# Patient Record
Sex: Male | Born: 1995 | Race: White | Hispanic: No
Health system: Southern US, Community
[De-identification: ages and names within clinical notes are randomized; demographics above are authoritative.]

---

## 2016-07-31 ENCOUNTER — Emergency Department
Admission: EM | Admit: 2016-07-31 | Discharge: 2016-07-31 | Disposition: A | Discharge status: Home / Self Care | Payer: BLUE CROSS/BLUE SHIELD | Attending: Emergency Medicine | Admitting: Emergency Medicine

## 2016-07-31 ENCOUNTER — Emergency Department: Payer: BLUE CROSS/BLUE SHIELD

## 2016-07-31 DIAGNOSIS — R4182 Altered mental status, unspecified: Secondary | ICD-10-CM | POA: Diagnosis present

## 2016-07-31 DIAGNOSIS — F1010 Alcohol abuse, uncomplicated: Secondary | ICD-10-CM | POA: Diagnosis not present

## 2016-07-31 LAB — ETHANOL: Alcohol, Ethyl (B): 301 mg/dL (ref <5)

## 2016-07-31 MED ORDER — SODIUM CHLORIDE 0.9 % IV BOLUS (SEPSIS)
1000.0000 mL | Freq: Once | INTRAVENOUS | Status: AC
  Administered 2016-07-31: 1000 mL via INTRAVENOUS

## 2016-07-31 NOTE — ED Notes (Signed)
Pt awake and now realizes he's in the emergency room; up to toilet to void with no difficulty ambulating; given blue scrubs to change into; pt talking in complete coherent sentences

## 2016-07-31 NOTE — ED Notes (Signed)
Pt stirs when name called but does not open eyes, no verbal communication; will continue to monitor

## 2016-07-31 NOTE — Discharge Instructions (Signed)
Please drink plenty of fluids and take Tylenol and/or ibuprofen today.  Please return immediately if condition worsens. Please contact her primary physician or the physician you were given for referral. If you have any specialist physicians involved in her treatment and plan please also contact them. Thank you for using [facility].

## 2016-07-31 NOTE — ED Triage Notes (Signed)
Pt had to be assisted out of car to stretcher by multiple staff members due to intoxication; vomit on face and clothing; pt does admit to drinking alcohol but will not state amount; denies drug use; will answer questions with short answer before nodding back off to sleep; cleaned well and placed in dry gown with yellow socks for safety

## 2016-07-31 NOTE — ED Notes (Signed)
Pt back from CT; side rails up x 2; sleeping soundly; will continue to monitor

## 2016-07-31 NOTE — ED Notes (Signed)
Pt sleeping soundly with unlabored respirations; HOB lowered and head placed on pillow for comfort; skin cool, warm blanket place over pt

## 2016-07-31 NOTE — ED Provider Notes (Signed)
Time Seen: Approximately 0121  I have reviewed the triage notes  Chief Complaint: Alcohol Intoxication   History of Present Illness: Anthony MinaDerek Waters is a 21 y.o. male who presents to have been removed from the vehicle and admits to heavy alcohol consumption. He had some nausea vomiting that was noticed to be on his face and clothing. He does not appear to be in any respiratory distress. He denies any illicit drug usage. History of obvious trauma.   No past medical history on file.  There are no active problems to display for this patient.   No past surgical history on file.  No past surgical history on file.    Allergies:  Patient has no known allergies.  Family History: No family history on file.  Social History: Social History  Substance Use Topics  . Smoking status: Not on file  . Smokeless tobacco: Not on file  . Alcohol use Not on file     Review of Systems:   10 point review of systems was performed and was otherwise negative: Review of systems was acquired later on during the patient's stay Constitutional: No fever Eyes: No visual disturbances ENT: No sore throat, ear pain Cardiac: No chest pain Respiratory: No shortness of breath, wheezing, or stridor Abdomen: No abdominal pain, no vomiting, No diarrhea Endocrine: No weight loss, No night sweats Extremities: No peripheral edema, cyanosis Skin: No rashes, easy bruising Neurologic: No focal weakness, trouble with speech or swollowing Urologic: No dysuria, Hematuria, or urinary frequency   Physical Exam:  ED Triage Vitals  Enc Vitals Group     BP 07/31/16 0053 (!) 125/105     Pulse Rate 07/31/16 0053 82     Resp 07/31/16 0053 (!) 21     Temp --      Temp src --      SpO2 07/31/16 0053 100 %     Weight 07/31/16 0102 165 lb (74.8 kg)     Height 07/31/16 0102 5\' 8"  (1.727 m)     Head Circumference --      Peak Flow --      Pain Score --      Pain Loc --      Pain Edu? --      Excl. in GC? --      General:Patient is arousable and appears to be protecting his airway though is obviously inebriated with diminished mental status Head: Normal cephalic , atraumatic Eyes: Pupils equal , round, reactive to light Nose/Throat: No nasal drainage, patent upper airway without erythema or exudate.  Neck: Supple, Full range of motion, No anterior adenopathy or palpable thyroid masses Lungs: Clear to ascultation without wheezes , rhonchi, or rales Heart: Regular rate, regular rhythm without murmurs , gallops , or rubs Abdomen: Soft, non tender without rebound, guarding , or rigidity; bowel sounds positive and symmetric in all 4 quadrants. No organomegaly .        Extremities: 2 plus symmetric pulses. No edema, clubbing or cyanosis Neurologic: normal ambulation, Motor symmetric without deficits, sensory intact Skin: warm, dry, no rashes   Labs:   All laboratory work was reviewed including any pertinent negatives or positives listed below:  Labs Reviewed  ETHANOL - Abnormal; Notable for the following:       Result Value   Alcohol, Ethyl (B) 301 (*)    All other components within normal limits    Radiology: * "Ct Head Wo Contrast  Result Date: 07/31/2016 CLINICAL DATA:  Unresponsive EXAM: CT  HEAD WITHOUT CONTRAST TECHNIQUE: Contiguous axial images were obtained from the base of the skull through the vertex without intravenous contrast. COMPARISON:  None. FINDINGS: Brain: No evidence of acute infarction, hemorrhage, hydrocephalus, extra-axial collection or mass lesion/mass effect. Vascular: No hyperdense vessel or unexpected calcification. Skull: Normal. Negative for fracture or focal lesion. Sinuses/Orbits: No acute finding. Other: None. IMPRESSION: No CT evidence for acute intracranial abnormality Electronically Signed   By: Jasmine Pang M.D.   On: 07/31/2016 01:31  " I personally reviewed the radiologic studies   ED ECG REPORT I, Jennye Moccasin, the attending physician, personally viewed  and interpreted this ECG.  Date: 07/31/2016 EKG Time: *0057 Rate: 78Rhythm: normal sinus rhythm QRS Axis: normal Intervals: normal ST/T Wave abnormalities: normal Conduction Disturbances: none Narrative Interpretation: unremarkable No acute ischemic changes  ED Course:  Patient was given a liter of fluid will be observed here in emergency department. Upon arousal patient will be reevaluated to make sure he is not suicidal, homicidal or having hallucinations able to demonstrate ambulation on his own accord. We found no other evidence of altered mental status other than alcohol intoxication. Clinical Course      Assessment: * Acute alcohol intoxication     Plan: * Observed to sobriety            Jennye Moccasin, MD 07/31/16 0630

## 2016-07-31 NOTE — ED Notes (Signed)
Alcohol 301 reported to Dr Huel CoteQuigley; no new orders give at this time

## 2018-01-18 IMAGING — CT CT HEAD W/O CM
3 series · 16 of 47 positions shown, 19 images · non-contrast
Comparison: None.

CLINICAL DATA: Unresponsive

EXAM:
CT HEAD WITHOUT CONTRAST
TECHNIQUE: Contiguous axial images were obtained from the base of the skull
through the vertex without intravenous contrast.

[Series 2: head wo · axial · 0.44mm/px · z∈[-41,+94]mm · 10 of 33 slices shown, 13 images]
[im 3/33  brain]
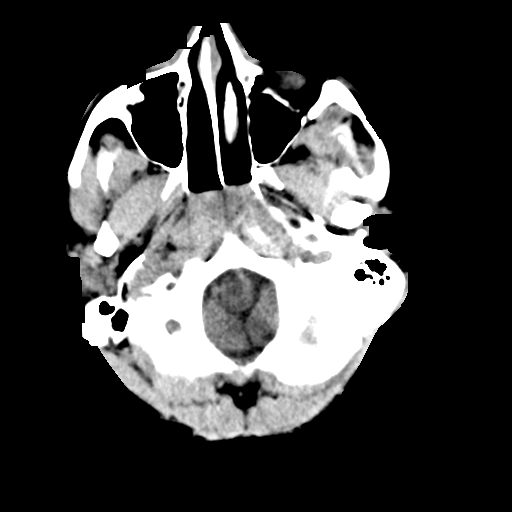
[im 3/33  bone]
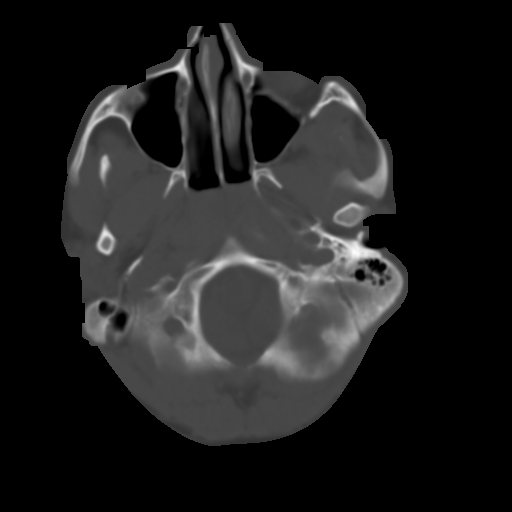
[im 6/33  brain]
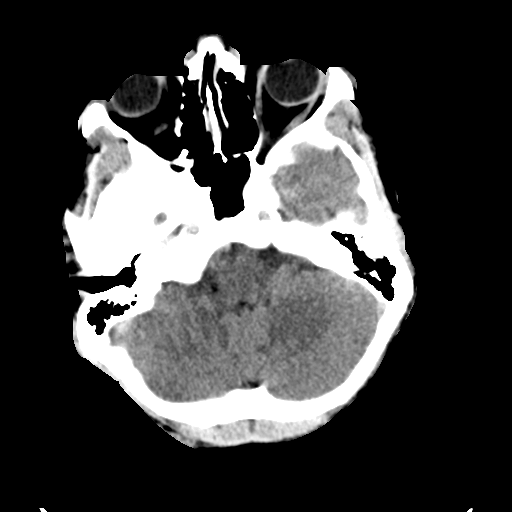
[im 9/33  brain]
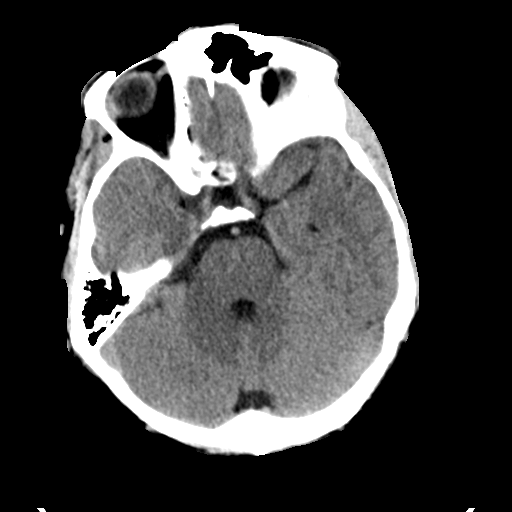
[im 12/33  brain]
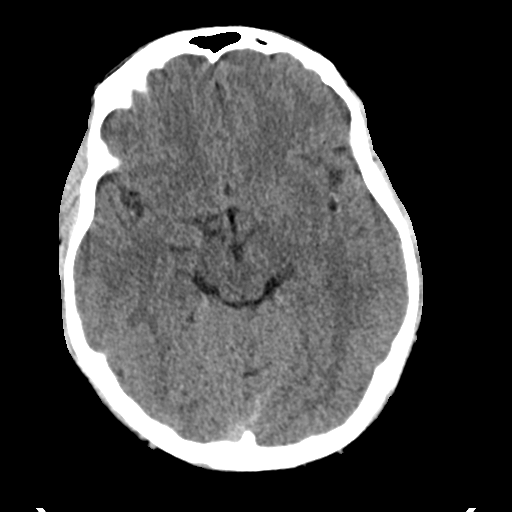
[im 15/33  brain]
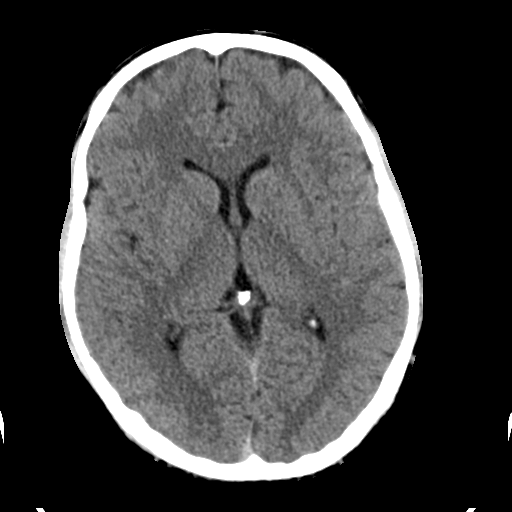
[im 15/33  bone]
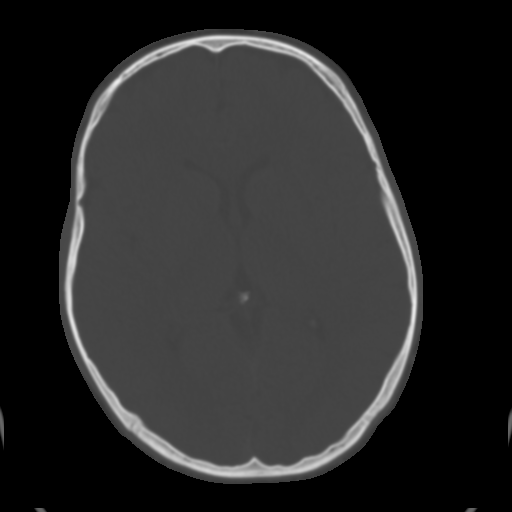
[im 18/33  brain]
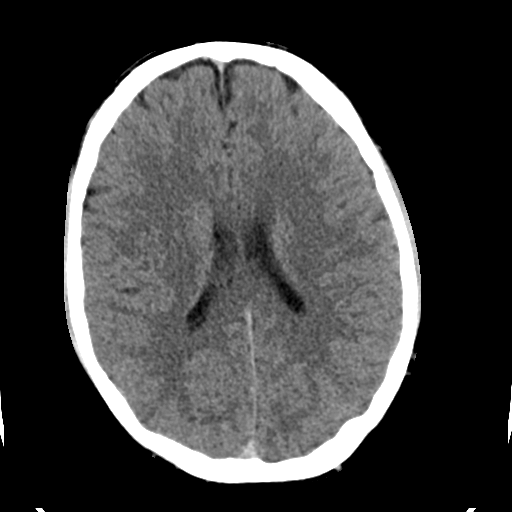
[im 21/33  brain]
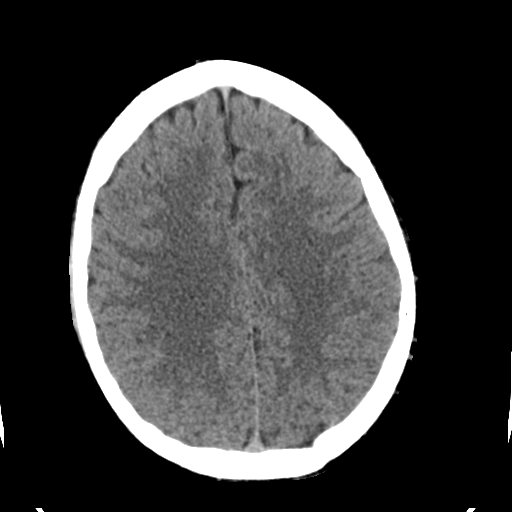
[im 25/33  brain]
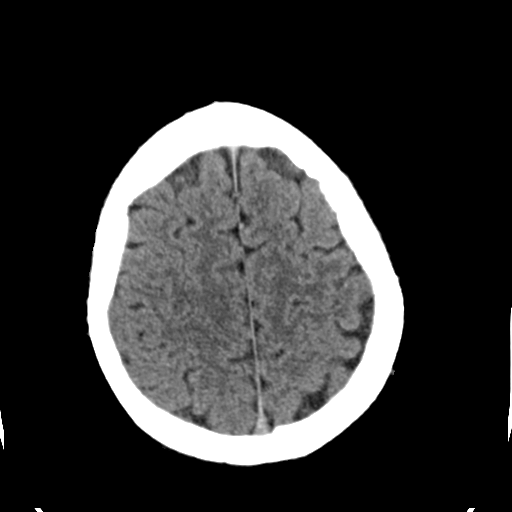
[im 27/33  brain]
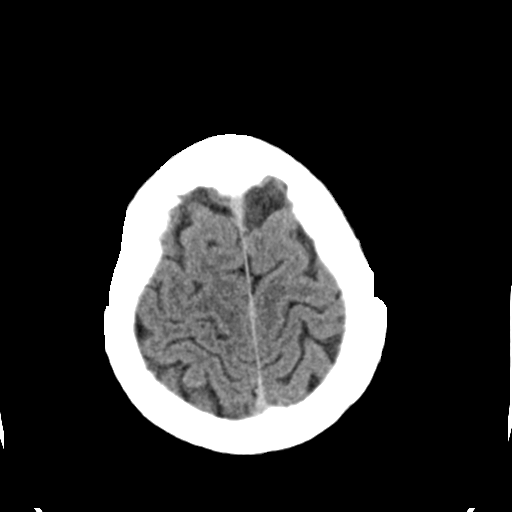
[im 27/33  bone]
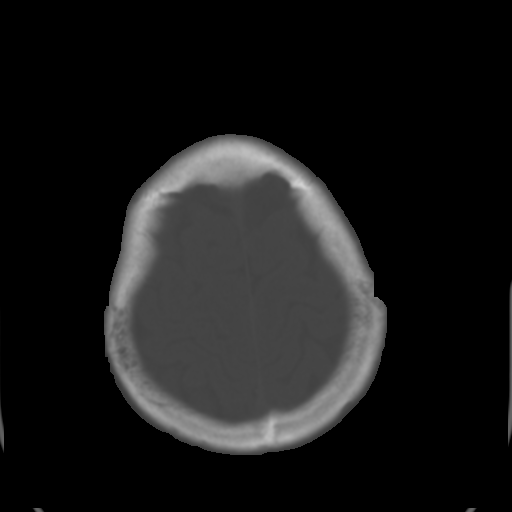
[im 30/33  brain]
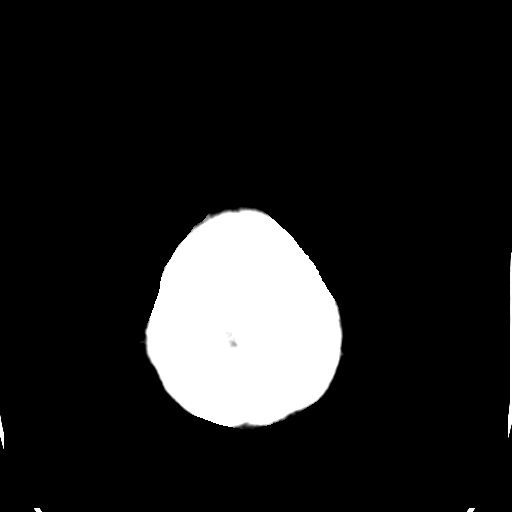

[Series 4: coronal soft tissue · coronal · 0.33mm/px · 3 of 69 slices shown]
[im 23/69  brain]
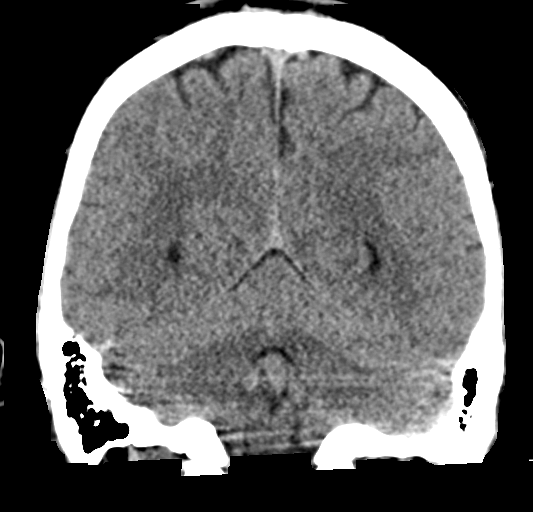
[im 31/69  brain]
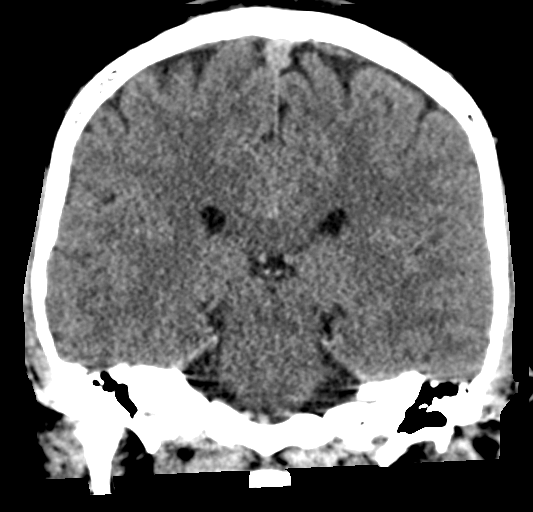
[im 38/69  brain]
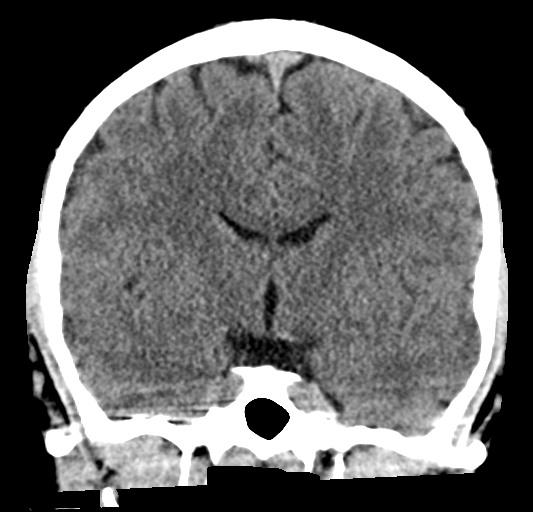

[Series 5: sagittal soft tissue · sagittal · 0.33mm/px · 3 of 59 slices shown]
[im 20/59  brain]
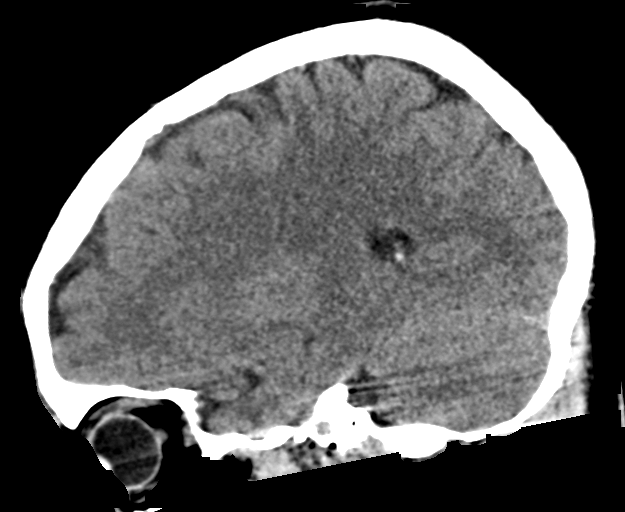
[im 30/59  brain]
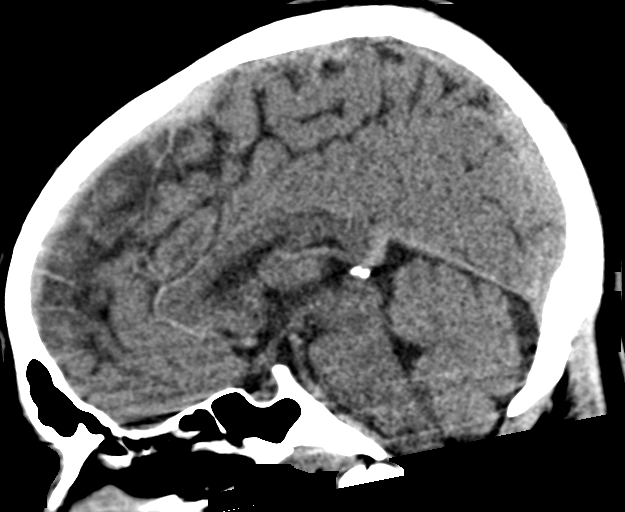
[im 39/59  brain]
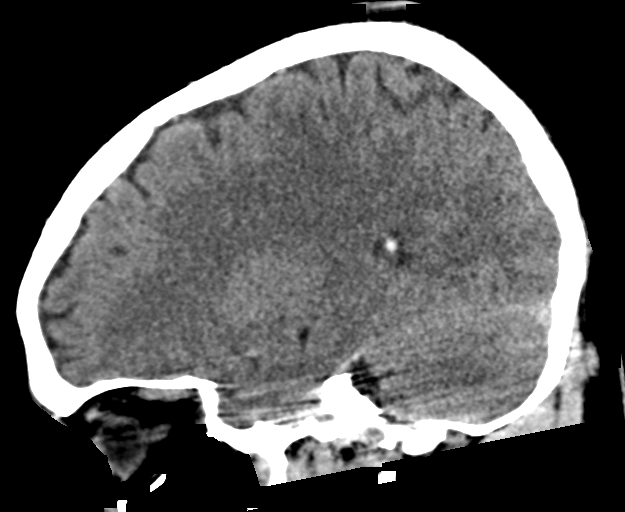

[16 of 47 positions shown; findings below may reference images not displayed]

FINDINGS: Brain: No evidence of acute infarction, hemorrhage, hydrocephalus,
extra-axial collection or mass lesion/mass effect.

Vascular: No hyperdense vessel or unexpected calcification.

Skull: Normal. Negative for fracture or focal lesion.

Sinuses/Orbits: No acute finding.

Other: None.
IMPRESSION: No CT evidence for acute intracranial abnormality
